# Patient Record
Sex: Female | Born: 1965 | Race: White | Hispanic: Yes | Marital: Married | Smoking: Never smoker
Health system: Southern US, Community
[De-identification: ages and names within clinical notes are randomized; demographics above are authoritative.]

---

## 2018-12-05 ENCOUNTER — Emergency Department (HOSPITAL_BASED_OUTPATIENT_CLINIC_OR_DEPARTMENT_OTHER): Payer: Self-pay

## 2018-12-05 ENCOUNTER — Encounter (HOSPITAL_BASED_OUTPATIENT_CLINIC_OR_DEPARTMENT_OTHER): Payer: Self-pay | Admitting: Emergency Medicine

## 2018-12-05 ENCOUNTER — Emergency Department (HOSPITAL_BASED_OUTPATIENT_CLINIC_OR_DEPARTMENT_OTHER)
Admission: EM | Admit: 2018-12-05 | Discharge: 2018-12-05 | Disposition: A | Discharge status: Home / Self Care | Payer: Self-pay | Attending: Emergency Medicine | Admitting: Emergency Medicine

## 2018-12-05 ENCOUNTER — Other Ambulatory Visit: Payer: Self-pay

## 2018-12-05 DIAGNOSIS — R0602 Shortness of breath: Secondary | ICD-10-CM | POA: Insufficient documentation

## 2018-12-05 DIAGNOSIS — R42 Dizziness and giddiness: Secondary | ICD-10-CM | POA: Insufficient documentation

## 2018-12-05 LAB — CBC WITH DIFFERENTIAL/PLATELET
Abs Immature Granulocytes: 0.02 10*3/uL (ref 0.00–0.07)
Basophils Absolute: 0.1 10*3/uL (ref 0.0–0.1)
Basophils Relative: 1 %
Eosinophils Absolute: 0.1 10*3/uL (ref 0.0–0.5)
Eosinophils Relative: 1 %
HCT: 38.4 % (ref 36.0–46.0)
HEMOGLOBIN: 12.2 g/dL (ref 12.0–15.0)
Immature Granulocytes: 0 %
Lymphocytes Relative: 44 %
Lymphs Abs: 2.8 10*3/uL (ref 0.7–4.0)
MCH: 26.5 pg (ref 26.0–34.0)
MCHC: 31.8 g/dL (ref 30.0–36.0)
MCV: 83.5 fL (ref 80.0–100.0)
MONO ABS: 0.5 10*3/uL (ref 0.1–1.0)
Monocytes Relative: 7 %
Neutro Abs: 2.9 10*3/uL (ref 1.7–7.7)
Neutrophils Relative %: 47 %
Platelets: 344 10*3/uL (ref 150–400)
RBC: 4.6 MIL/uL (ref 3.87–5.11)
RDW: 14.7 % (ref 11.5–15.5)
WBC: 6.4 10*3/uL (ref 4.0–10.5)
nRBC: 0 % (ref 0.0–0.2)

## 2018-12-05 LAB — URINALYSIS, MICROSCOPIC (REFLEX)

## 2018-12-05 LAB — URINALYSIS, ROUTINE W REFLEX MICROSCOPIC
Bilirubin Urine: NEGATIVE
Glucose, UA: NEGATIVE mg/dL
Ketones, ur: NEGATIVE mg/dL
Nitrite: NEGATIVE
Protein, ur: NEGATIVE mg/dL
pH: 6 (ref 5.0–8.0)

## 2018-12-05 LAB — BASIC METABOLIC PANEL
Anion gap: 8 (ref 5–15)
BUN: 10 mg/dL (ref 6–20)
CO2: 23 mmol/L (ref 22–32)
Calcium: 9.1 mg/dL (ref 8.9–10.3)
Chloride: 105 mmol/L (ref 98–111)
Creatinine, Ser: 0.65 mg/dL (ref 0.44–1.00)
GFR calc Af Amer: >60 mL/min (ref >60)
GFR calc non Af Amer: >60 mL/min (ref >60)
Glucose, Bld: 91 mg/dL (ref 70–99)
Potassium: 3.3 mmol/L — ABNORMAL LOW (ref 3.5–5.1)
Sodium: 136 mmol/L (ref 135–145)

## 2018-12-05 LAB — TROPONIN I: Troponin I: <0.03 ng/mL (ref <0.03)

## 2018-12-05 MED ORDER — METOCLOPRAMIDE HCL 5 MG/ML IJ SOLN
10.0000 mg | Freq: Once | INTRAMUSCULAR | Status: AC
  Administered 2018-12-05: 10 mg via INTRAVENOUS
  Filled 2018-12-05: qty 2

## 2018-12-05 MED ORDER — DIPHENHYDRAMINE HCL 50 MG/ML IJ SOLN
12.5000 mg | Freq: Once | INTRAMUSCULAR | Status: AC
  Administered 2018-12-05: 12.5 mg via INTRAVENOUS
  Filled 2018-12-05: qty 1

## 2018-12-05 MED ORDER — POTASSIUM CHLORIDE CRYS ER 20 MEQ PO TBCR
40.0000 meq | EXTENDED_RELEASE_TABLET | Freq: Once | ORAL | Status: AC
  Administered 2018-12-05: 40 meq via ORAL
  Filled 2018-12-05: qty 2

## 2018-12-05 MED ORDER — SODIUM CHLORIDE 0.9 % IV BOLUS
500.0000 mL | Freq: Once | INTRAVENOUS | Status: AC
  Administered 2018-12-05: 500 mL via INTRAVENOUS

## 2018-12-05 MED ORDER — ACETAMINOPHEN 500 MG PO TABS
1000.0000 mg | ORAL_TABLET | Freq: Once | ORAL | Status: AC
  Administered 2018-12-05: 1000 mg via ORAL
  Filled 2018-12-05: qty 2

## 2018-12-05 NOTE — ED Notes (Signed)
Patient transported to CT 

## 2018-12-05 NOTE — ED Triage Notes (Signed)
Dizziness and headache x 3 days.

## 2018-12-05 NOTE — ED Notes (Signed)
ED Provider at bedside. 

## 2018-12-05 NOTE — ED Notes (Signed)
Pt lying flat 

## 2018-12-05 NOTE — ED Triage Notes (Signed)
While asking the rest of triage questions the patient states that she has an episode of dizziness about 1 week ago and it last a little while and then she did not have any symptoms  - the patient also denies a Headache at this time

## 2018-12-05 NOTE — ED Notes (Signed)
AMBULATING: Pt able to ambulate unassisted, after approx 48ft pt began to sway and state she was dizzy. Able to walk remainder of distance holding onto staff member x1 MIN assist.   SpO2 98-100% HR 60-65bpm

## 2018-12-05 NOTE — ED Notes (Signed)
Will ambulate after fluids are finished. RN aware.

## 2018-12-05 NOTE — ED Notes (Signed)
Patient transported to X-ray 

## 2018-12-05 NOTE — ED Provider Notes (Signed)
[facility] EMERGENCY DEPARTMENT Provider Note   CSN: [id number] Arrival date & time: 12/05/18  1748    History   Chief Complaint Chief Complaint  Patient presents with  . Dizziness   Pt declined translator. She speaks some english and her husband is at bedside  HPI Sydney Hernandez is a 53 y.o. female.     HPI   Pt is a 53 y/o female who presents to the ED today for evaluation of multiple complaints.  Patient complains that she has had intermittent dizziness for the last several years.  She has had recurrence of her symptoms over the last 2 weeks and for the last 3 days she has been dizzy mainly with movement.  States that she feels off balance.  When she has these symptoms, she normally drinks valerian tea and rests and her symptoms resolved.  She states this is consistent with her chronic symptoms over the last 2 years however she became concerned because she was been feeling fatigued lately and has a sour taste in her mouth.  She also complains of some mild nausea and a headache.  She states that she has had an intermittent headache that is "mild "and "not bad ".  States that the pain occurs in different areas of her head and it feels like a "pressure "and "puncture ".  States the episodes last for about 15 minutes.  Denies any vision changes, numbness or weakness.  She states she feels a little bit short of breath today starting when she woke up this morning.  She denies any chest pain or pain with inspiration.  She denies a cough.  She has had no recent surgeries, hospital admissions, extended periods of travel.  She does not use estrogen.  She is not had a history of cancer or VTE.  No lower extremity swelling or calf pain.  She states she has been evaluated for these symptoms in the past in [place], [place] and was told that she had depression.  History reviewed. No pertinent past medical history.  There are no active problems to display for this patient.   History  reviewed. No pertinent surgical history.   OB History   No obstetric history on file.      Home Medications    Prior to Admission medications   Not on File    Family History History reviewed. No pertinent family history.  Social History Social History   Tobacco Use  . Smoking status: Never Smoker  . Smokeless tobacco: Never Used  Substance Use Topics  . Alcohol use: Never    Frequency: Never  . Drug use: Never     Allergies   Patient has no known allergies.   Review of Systems Review of Systems  Constitutional: Negative for fever.  HENT: Negative for ear pain and sore throat.   Eyes: Negative for visual disturbance.  Respiratory: Positive for shortness of breath. Negative for cough.   Cardiovascular: Negative for chest pain.  Gastrointestinal: Positive for nausea. Negative for abdominal pain, constipation, diarrhea and vomiting.  Genitourinary: Negative for dysuria.  Musculoskeletal: Negative for back pain.  Skin: Negative for color change and rash.  Neurological: Positive for dizziness and headaches. Negative for weakness, light-headedness and numbness.  All other systems reviewed and are negative.   Physical Exam Updated Vital Signs BP 118/79 (BP Location: Right Arm)   Pulse 60   Temp 97.8 F (36.6 C)   Resp 16   Ht 5\' 6"  (1.676 m)   Wt  59 kg   SpO2 100%   BMI 20.98 kg/m   Physical Exam Vitals signs and nursing note reviewed.  Constitutional:      General: She is not in acute distress.    Appearance: She is well-developed.  HENT:     Head: Normocephalic and atraumatic.     Right Ear: Tympanic membrane normal.     Ears:     Comments: Scarring to left TM.    Mouth/Throat:     Mouth: Mucous membranes are moist.     Pharynx: No oropharyngeal exudate or posterior oropharyngeal erythema.  Eyes:     Conjunctiva/sclera: Conjunctivae normal.  Neck:     Musculoskeletal: Neck supple. No neck rigidity.  Cardiovascular:     Rate and Rhythm: Normal  rate and regular rhythm.     Heart sounds: Normal heart sounds. No murmur.  Pulmonary:     Effort: Pulmonary effort is normal. No respiratory distress.     Breath sounds: Normal breath sounds. No stridor. No wheezing or rhonchi.  Abdominal:     General: Bowel sounds are normal.     Palpations: Abdomen is soft.     Tenderness: There is no abdominal tenderness. There is no guarding or rebound.  Skin:    General: Skin is warm and dry.  Neurological:     Mental Status: She is alert.     Comments: Mental Status:  Alert, thought content appropriate, able to give a coherent history. Speech fluent without evidence of aphasia. Able to follow 2 step commands without difficulty.  Cranial Nerves:  II:  pupils equal, round, reactive to light III,IV, VI: ptosis not present, extra-ocular motions intact bilaterally  V,VII: smile symmetric, facial light touch sensation equal VIII: hearing grossly normal to voice  X: uvula elevates symmetrically  XI: bilateral shoulder shrug symmetric and strong XII: midline tongue extension without fassiculations Motor:  Normal tone. 5/5 strength of BUE and BLE major muscle groups including strong and equal grip strength and dorsiflexion/plantar flexion Sensory: light touch normal in all extremities. Cerebellar: normal finger-to-nose with bilateral upper extremities Gait: normal gait and balance.  Negative romberg      ED Treatments / Results  Labs (all labs ordered are listed, but only abnormal results are displayed) Labs Reviewed  BASIC METABOLIC PANEL - Abnormal; Notable for the following components:      Result Value   Potassium 3.3 (*)    All other components within normal limits  URINALYSIS, ROUTINE W REFLEX MICROSCOPIC - Abnormal; Notable for the following components:   Color, Urine STRAW (*)    Specific Gravity, Urine <1.005 (*)    Hgb urine dipstick TRACE (*)    Leukocytes,Ua TRACE (*)    All other components within normal limits  URINALYSIS,  MICROSCOPIC (REFLEX) - Abnormal; Notable for the following components:   Bacteria, UA RARE (*)    All other components within normal limits  URINE CULTURE  CBC WITH DIFFERENTIAL/PLATELET  TROPONIN I  I-STAT TROPONIN, ED    EKG EKG Interpretation  Date/Time:  Saturday December 05 2018 18:01:47 EST Ventricular Rate:  76 PR Interval:    QRS Duration: 93 QT Interval:  384 QTC Calculation: 432 R Axis:   92 Text Interpretation:  Sinus rhythm Borderline right axis deviation No old tracing to compare Confirmed by Linwood Dibbles [phone]) on 12/05/2018 6:04:29 PM   Radiology Dg Chest 2 View  Result Date: 12/05/2018 CLINICAL DATA:  Acute chest pain and shortness of breath. EXAM: CHEST - 2 VIEW COMPARISON:  None. FINDINGS: The cardiomediastinal silhouette is unremarkable. There is no evidence of focal airspace disease, pulmonary edema, suspicious pulmonary nodule/mass, pleural effusion, or pneumothorax. No acute bony abnormalities are identified. IMPRESSION: No active cardiopulmonary disease. Electronically Signed   By: Harmon Pier M.D.   On: 12/05/2018 19:45   Ct Head Wo Contrast  Result Date: 12/05/2018 CLINICAL DATA:  Dizziness. EXAM: CT HEAD WITHOUT CONTRAST TECHNIQUE: Contiguous axial images were obtained from the base of the skull through the vertex without intravenous contrast. COMPARISON:  None. FINDINGS: Brain: No evidence of acute infarction, hemorrhage, hydrocephalus, extra-axial collection or mass lesion/mass effect. Vascular: No hyperdense vessel or unexpected calcification. Skull: Normal. Negative for fracture or focal lesion. Sinuses/Orbits: No acute finding. Other: None. IMPRESSION: Negative exam. Electronically Signed   By: Norva Pavlov M.D.   On: 12/05/2018 21:41    Procedures Procedures (including critical care time)  Medications Ordered in ED Medications  sodium chloride 0.9 % bolus 500 mL (0 mLs Intravenous Stopped 12/05/18 2011)  acetaminophen (TYLENOL) tablet 1,000 mg  (1,000 mg Oral Given 12/05/18 1929)  metoCLOPramide (REGLAN) injection 10 mg (10 mg Intravenous Given 12/05/18 1929)  diphenhydrAMINE (BENADRYL) injection 12.5 mg (12.5 mg Intravenous Given 12/05/18 1929)  potassium chloride SA (K-DUR,KLOR-CON) CR tablet 40 mEq (40 mEq Oral Given 12/05/18 2011)     Initial Impression / Assessment and Plan / ED Course  I have reviewed the triage vital signs and the nursing notes.  Pertinent labs & imaging results that were available during my care of the patient were reviewed by me and considered in my medical decision making (see chart for details).       Final Clinical Impressions(s) / ED Diagnoses   Final diagnoses:  Vertigo   Pt with a h/o chronic intermittent dizziness presents with dizziness that has been intermittent for the last two weeks. Also with headache and multiple other concerns.   She is afebrile. Her initial BP is elevated however it improved throughout her stay and even before intervention with pain medications. The remainder of her vitals are normal.  She is nontoxic appearing and is in no acute distress. Her neurologic exam is nonfocal and is very reassuring. She has a negative romberg and has no difficulty with ambulation.   Orthostatics negative Amb with pulse ox and she maintain oxygen saturation at 98 200% on room air.  CBC with no leukocytosis or anemia. CMP with mild hypokalemia supplemented in the ED.  Normal liver and kidney function. Trop negative.  CXR is within normal limits. EKG with NSR. Borderline right axis deviation, No old tracing to compare.   CT of the head was negative for acute abnormality.  She was initially ambulated about 20 minutes after she was given her medications and reportedly felt dizzy.  On reassessment medications patient states that her symptoms have completely resolved.  She no longer has a headache and no longer feels dizzy.  I ambulated her a second time up and down the hallway and she  reported no recurrence of dizziness.  She also did not feel any shortness of breath.  States she feels completely back to baseline.  This point I have very low suspicion for CVA or other central cause of her symptoms, especially given the chronicity of patient's symptoms over a period of several years.  I do feel that she would likely benefit from referral to neurology and therefore referral was given.  Advised her to make appoint for follow-up and to return the ER for new or  worsening symptoms.  She voiced understanding the plan and reasons to return.  All questions answered.  Patient stable for discharge.  ED Discharge Orders         Ordered    Ambulatory referral to Neurology    Comments:  An appointment is requested in approximately: 1-2 weeks   12/05/18 2226           Rayne DuCouture, Maureen Duesing S, PA-C 12/05/18 2228    Linwood DibblesKnapp, Jon, MD 12/05/18 2239

## 2018-12-05 NOTE — Discharge Instructions (Addendum)
You were given a referral to neurology. The office will contact you to make an appointment for follow up.   Please return to the emergency department for any new or worsening symptoms.   ------------------------------------------------------------------------------------------   Te derivaron a neurologa. La oficina se pondr en contacto con usted para hacer una cita para el seguimiento.   Por favor, regrese al departamento de emergencias para cualquier sntoma nuevo o que empeore.

## 2018-12-07 LAB — URINE CULTURE: Culture: <10000 — AB

## 2018-12-08 ENCOUNTER — Emergency Department (HOSPITAL_BASED_OUTPATIENT_CLINIC_OR_DEPARTMENT_OTHER)
Admission: EM | Admit: 2018-12-08 | Discharge: 2018-12-08 | Disposition: A | Discharge status: Home / Self Care | Payer: Self-pay | Attending: Emergency Medicine | Admitting: Emergency Medicine

## 2018-12-08 ENCOUNTER — Encounter (HOSPITAL_BASED_OUTPATIENT_CLINIC_OR_DEPARTMENT_OTHER): Payer: Self-pay | Admitting: Emergency Medicine

## 2018-12-08 ENCOUNTER — Other Ambulatory Visit: Payer: Self-pay

## 2018-12-08 DIAGNOSIS — R42 Dizziness and giddiness: Secondary | ICD-10-CM | POA: Insufficient documentation

## 2018-12-08 LAB — BASIC METABOLIC PANEL
Anion gap: 6 (ref 5–15)
BUN: 10 mg/dL (ref 6–20)
CALCIUM: 9 mg/dL (ref 8.9–10.3)
CO2: 25 mmol/L (ref 22–32)
Chloride: 105 mmol/L (ref 98–111)
Creatinine, Ser: 0.59 mg/dL (ref 0.44–1.00)
GFR calc Af Amer: >60 mL/min (ref >60)
Glucose, Bld: 102 mg/dL — ABNORMAL HIGH (ref 70–99)
Potassium: 3.5 mmol/L (ref 3.5–5.1)
SODIUM: 136 mmol/L (ref 135–145)

## 2018-12-08 MED ORDER — KETOROLAC TROMETHAMINE 30 MG/ML IJ SOLN
30.0000 mg | Freq: Once | INTRAMUSCULAR | Status: AC
  Administered 2018-12-08: 30 mg via INTRAVENOUS
  Filled 2018-12-08: qty 1

## 2018-12-08 MED ORDER — MECLIZINE HCL 25 MG PO TABS: 25.0000 mg | ORAL_TABLET | Freq: Three times a day (TID) | ORAL | 0 refills | Status: AC | PRN

## 2018-12-08 MED ORDER — METOCLOPRAMIDE HCL 5 MG/ML IJ SOLN
10.0000 mg | Freq: Once | INTRAMUSCULAR | Status: AC
  Administered 2018-12-08: 10 mg via INTRAVENOUS
  Filled 2018-12-08: qty 2

## 2018-12-08 MED ORDER — SODIUM CHLORIDE 0.9 % IV BOLUS
1000.0000 mL | Freq: Once | INTRAVENOUS | Status: AC
  Administered 2018-12-08: 1000 mL via INTRAVENOUS

## 2018-12-08 MED ORDER — DIPHENHYDRAMINE HCL 50 MG/ML IJ SOLN
25.0000 mg | Freq: Once | INTRAMUSCULAR | Status: AC
  Administered 2018-12-08: 25 mg via INTRAVENOUS
  Filled 2018-12-08: qty 1

## 2018-12-08 NOTE — ED Provider Notes (Signed)
[facility] EMERGENCY DEPARTMENT Provider Note   CSN: [id number] Arrival date & time: 12/08/18  1540    History   Chief Complaint Chief Complaint  Patient presents with  . Dizziness    HPI Sydney Hernandez is a 53 y.o. female.     Patient is a 53 year old female with history of chronic, episodic dizziness.  She presents today with complaints of headache and dizziness, along with multiple other issues.  She states that she has a pain to the back of her head along with a dizzy sensation that she describes as a "spinning, as if I am drunk".  This is worse when she stands and moves and turns her head.  She also complains of a sour taste in her mouth, cold feet, and having difficulty sleeping.  She was here 3 days ago with similar complaints.  Extensive work-up was performed including CT scan of the head, laboratory studies.  She was given IV fluids and medications and was feeling better.  She was then discharged with follow-up with neurology and her PCP.  Symptoms have since returned.  The history is provided by the patient.  Dizziness  Quality:  Room spinning and vertigo Severity:  Moderate Onset quality:  Gradual Duration:  5 days Timing:  Constant Progression:  Unchanged Chronicity:  New Relieved by:  Nothing Worsened by:  Standing up and turning head Ineffective treatments:  None tried   History reviewed. No pertinent past medical history.  There are no active problems to display for this patient.   History reviewed. No pertinent surgical history.   OB History   No obstetric history on file.      Home Medications    Prior to Admission medications   Not on File    Family History No family history on file.  Social History Social History   Tobacco Use  . Smoking status: Never Smoker  . Smokeless tobacco: Never Used  Substance Use Topics  . Alcohol use: Never    Frequency: Never  . Drug use: Never     Allergies   Patient has no known  allergies.   Review of Systems Review of Systems  Neurological: Positive for dizziness.  All other systems reviewed and are negative.    Physical Exam Updated Vital Signs BP 131/85 (BP Location: Left Arm)   Pulse 81   Temp 98.6 F (37 C) (Oral)   Resp 16   Ht 5\' 6"  (1.676 m)   Wt 58 kg   SpO2 98%   BMI 20.64 kg/m   Physical Exam Vitals signs and nursing note reviewed.  Constitutional:      General: She is not in acute distress.    Appearance: Normal appearance. She is well-developed. She is not ill-appearing, toxic-appearing or diaphoretic.  HENT:     Head: Normocephalic and atraumatic.     Right Ear: Tympanic membrane normal.     Left Ear: Tympanic membrane normal.     Mouth/Throat:     Mouth: Mucous membranes are moist.  Eyes:     Extraocular Movements: Extraocular movements intact.     Pupils: Pupils are equal, round, and reactive to light.  Neck:     Musculoskeletal: Normal range of motion and neck supple.  Cardiovascular:     Rate and Rhythm: Normal rate and regular rhythm.     Heart sounds: No murmur. No friction rub. No gallop.   Pulmonary:     Effort: Pulmonary effort is normal. No respiratory distress.  Breath sounds: Normal breath sounds. No wheezing.  Abdominal:     General: Bowel sounds are normal. There is no distension.     Palpations: Abdomen is soft.     Tenderness: There is no abdominal tenderness.  Musculoskeletal: Normal range of motion.     Right lower leg: No edema.     Left lower leg: No edema.  Skin:    General: Skin is warm and dry.  Neurological:     General: No focal deficit present.     Mental Status: She is alert and oriented to person, place, and time.     Cranial Nerves: No cranial nerve deficit.     Motor: No weakness.     Coordination: Coordination normal.      ED Treatments / Results  Labs (all labs ordered are listed, but only abnormal results are displayed) Labs Reviewed  BASIC METABOLIC PANEL     EKG None  Radiology No results found.  Procedures Procedures (including critical care time)  Medications Ordered in ED Medications  sodium chloride 0.9 % bolus 1,000 mL (has no administration in time range)  metoCLOPramide (REGLAN) injection 10 mg (has no administration in time range)  diphenhydrAMINE (BENADRYL) injection 25 mg (has no administration in time range)  ketorolac (TORADOL) 30 MG/ML injection 30 mg (has no administration in time range)     Initial Impression / Assessment and Plan / ED Course  I have reviewed the triage vital signs and the nursing notes.  Pertinent labs & imaging results that were available during my care of the patient were reviewed by me and considered in my medical decision making (see chart for details).  Patient given medications here in the ER and is feeling better.  She continues to complain about cold hands and feet.  She has brisk capillary refill and ulnar and radial pulses are bounding as are DP and PT pulses to both feet.  I am also uncertain as to what is causing the patient to have a sour taste in her mouth, but nothing today appears emergent.  Patient needs to follow-up with her primary care doctor.  I will prescribe her meclizine which she can take for her dizziness.  Final Clinical Impressions(s) / ED Diagnoses   Final diagnoses:  None    ED Discharge Orders    None       Geoffery Lyons, MD 12/08/18 [phone]

## 2018-12-08 NOTE — ED Notes (Signed)
ED Provider at bedside. 

## 2018-12-08 NOTE — Discharge Instructions (Signed)
Begin taking meclizine as prescribed as needed for dizziness.  Follow-up with your primary doctor in the next week if your symptoms or not improving.

## 2018-12-08 NOTE — ED Triage Notes (Signed)
Pt reports ongoing dizziness and headache. Was seen here 2/29 for same. Her PCP cannot see her until later this month.

## 2018-12-15 ENCOUNTER — Ambulatory Visit: Payer: Self-pay | Admitting: Neurology

## 2020-02-11 IMAGING — CT CT HEAD W/O CM
3 of 4 series · 15 of 47 positions shown, 18 images · non-contrast
Comparison: None.

CLINICAL DATA: Dizziness.

EXAM:
CT HEAD WITHOUT CONTRAST
TECHNIQUE: Contiguous axial images were obtained from the base of the skull
through the vertex without intravenous contrast.

[Series 2: head wo · axial · 0.39mm/px · z∈[+774,+900]mm · 9 of 31 slices shown, 12 images]
[im 3/31  brain]
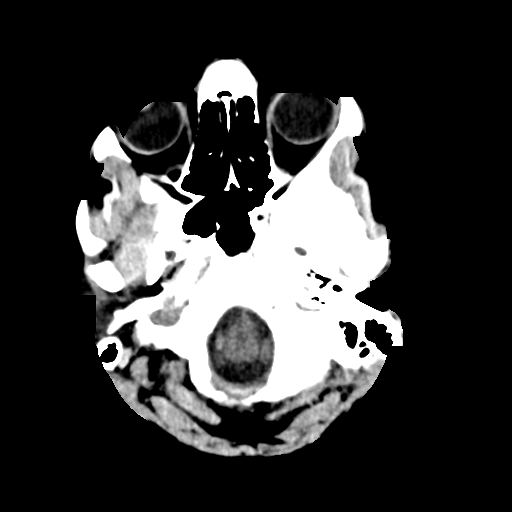
[im 3/31  bone]
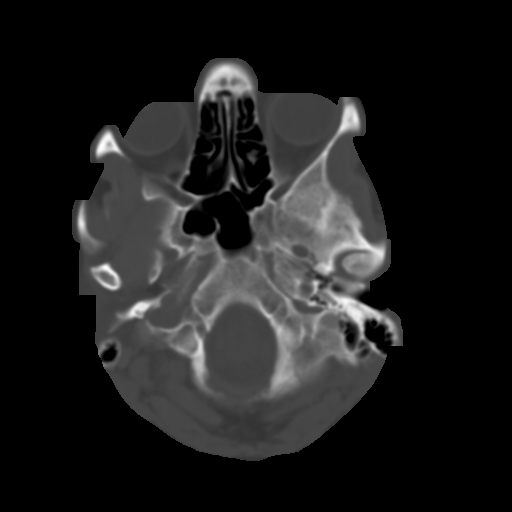
[im 6/31  brain]
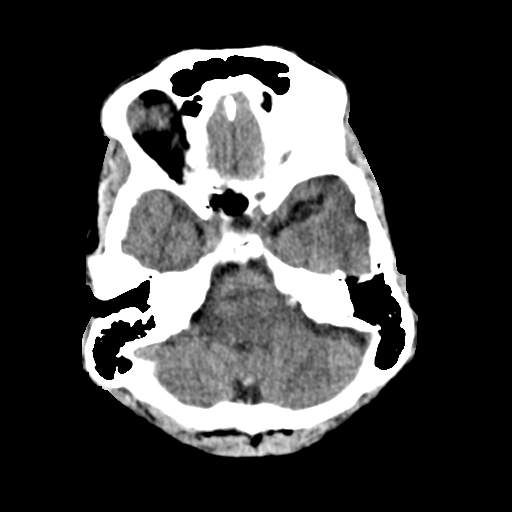
[im 9/31  brain]
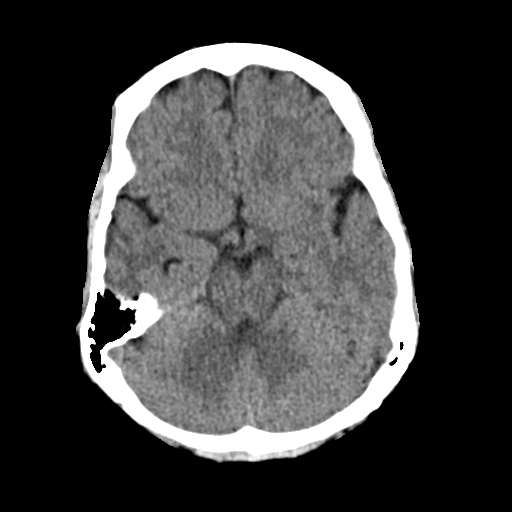
[im 13/31  brain]
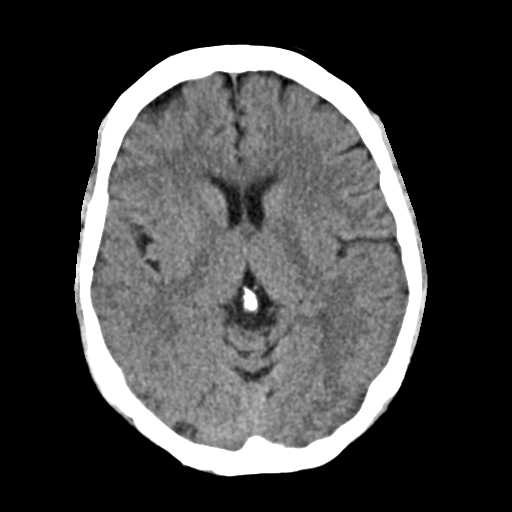
[im 15/31  brain]
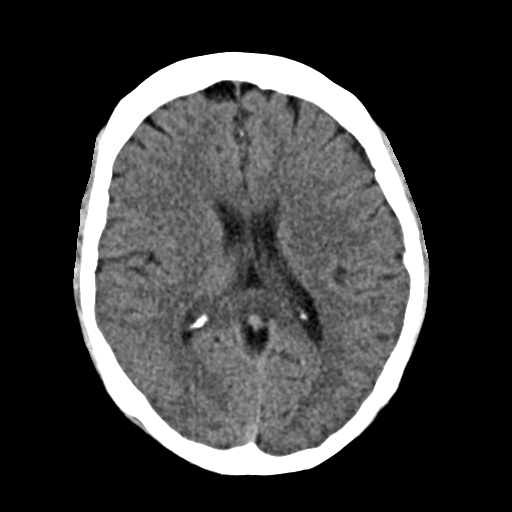
[im 15/31  bone]
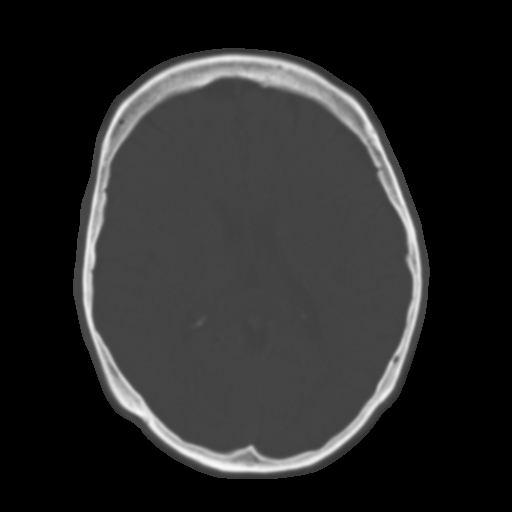
[im 18/31  brain]
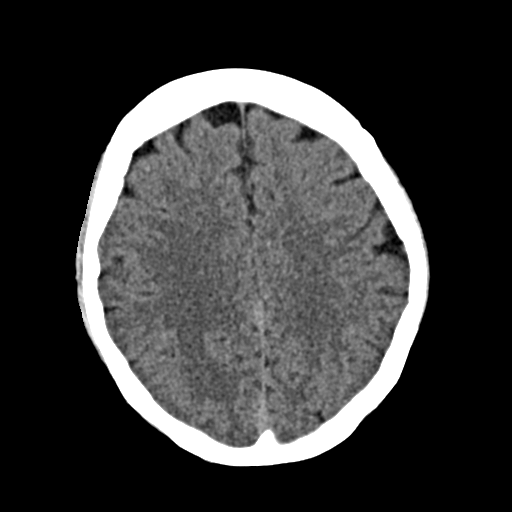
[im 22/31  brain]
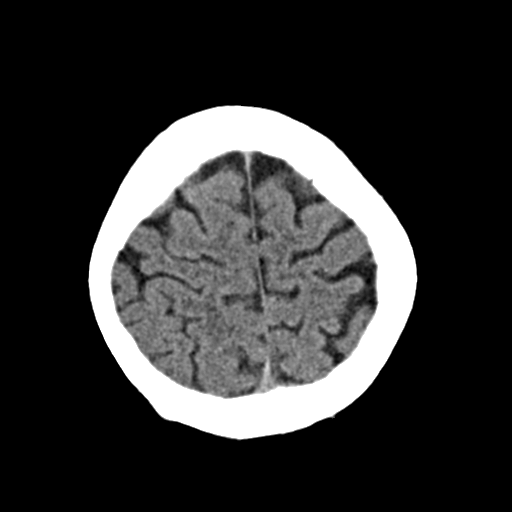
[im 25/31  brain]
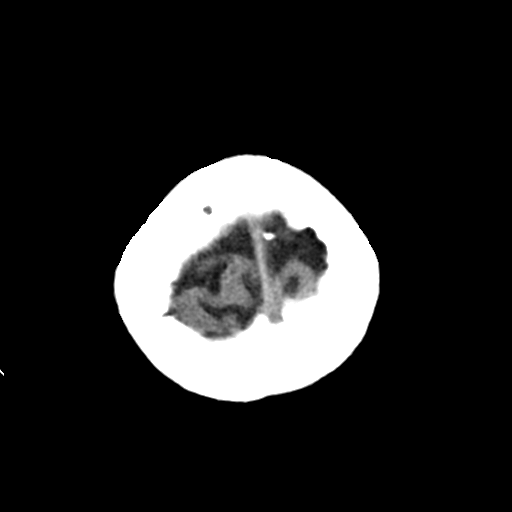
[im 28/31  brain]
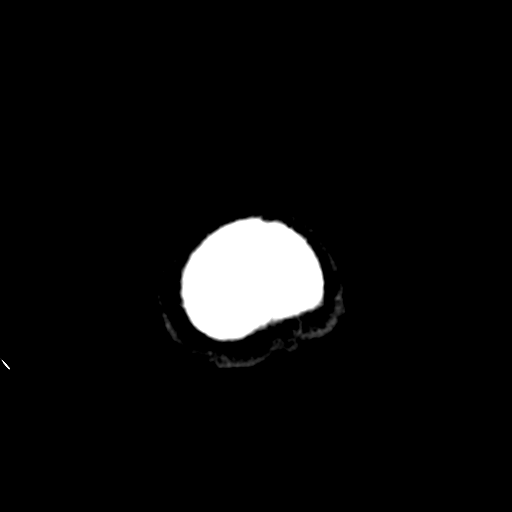
[im 28/31  bone]
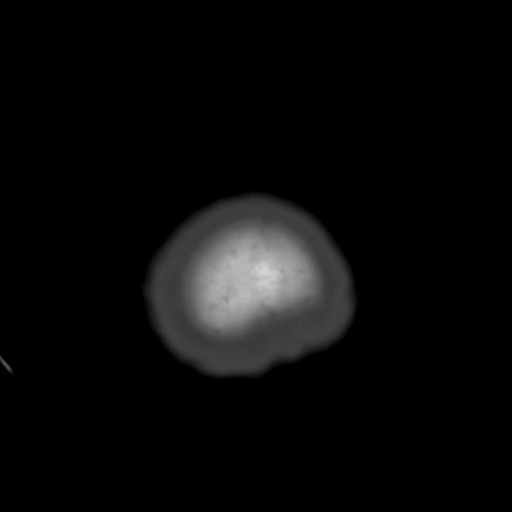

[Series 4: coronal soft · coronal · 0.31mm/px · 3 of 67 slices shown]
[im 23/67  brain]
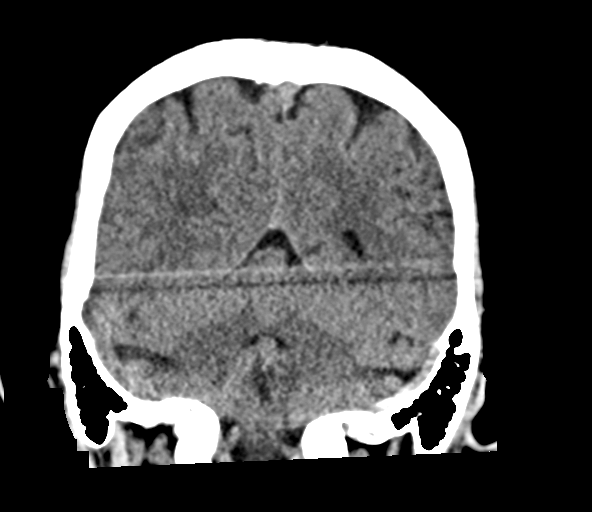
[im 30/67  brain]
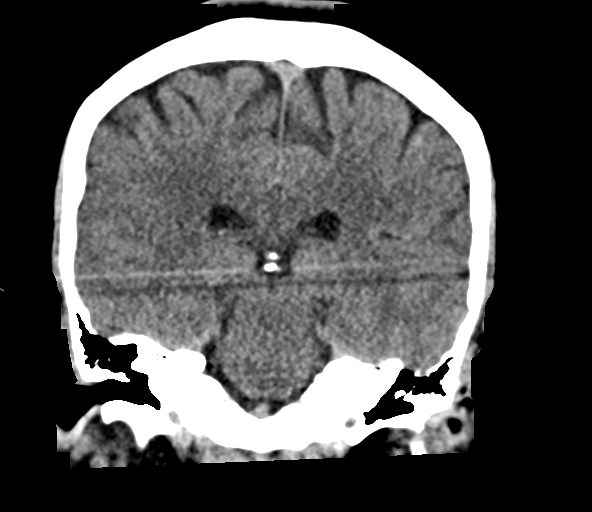
[im 37/67  brain]
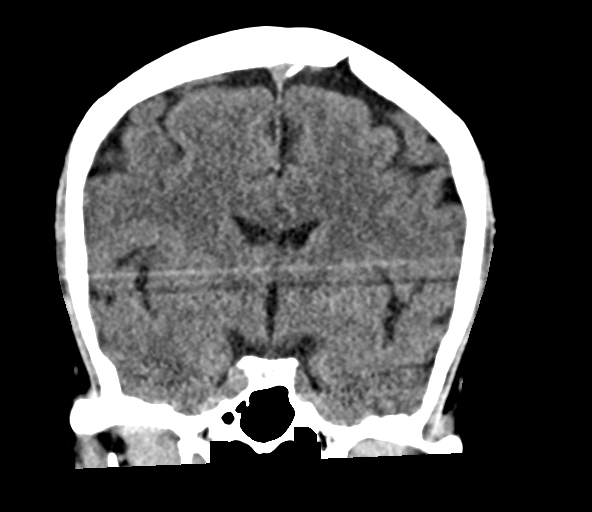

[Series 5: sag soft · sagittal · 0.29mm/px · 3 of 65 slices shown]
[im 22/65  brain]
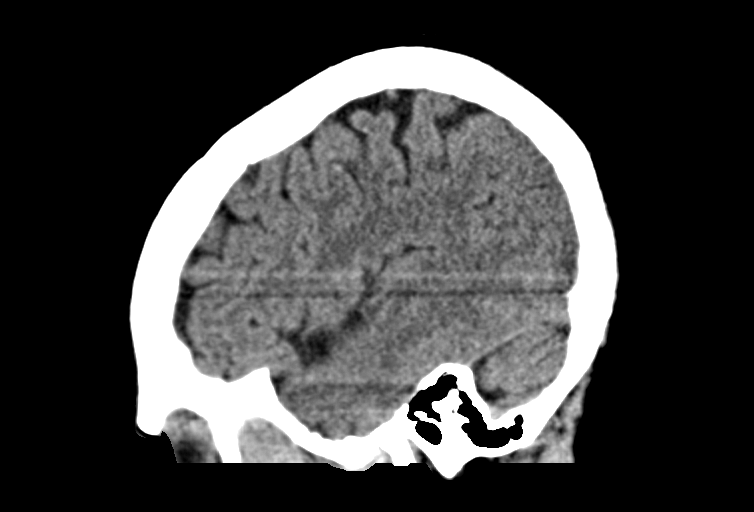
[im 33/65  brain]
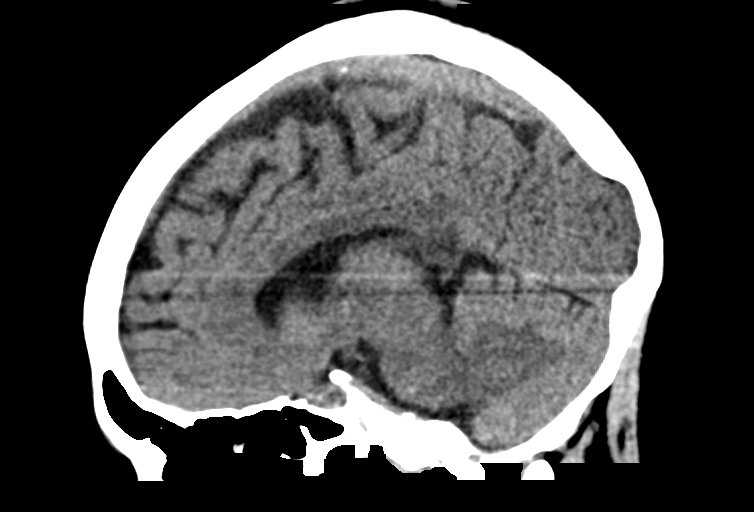
[im 43/65  brain]
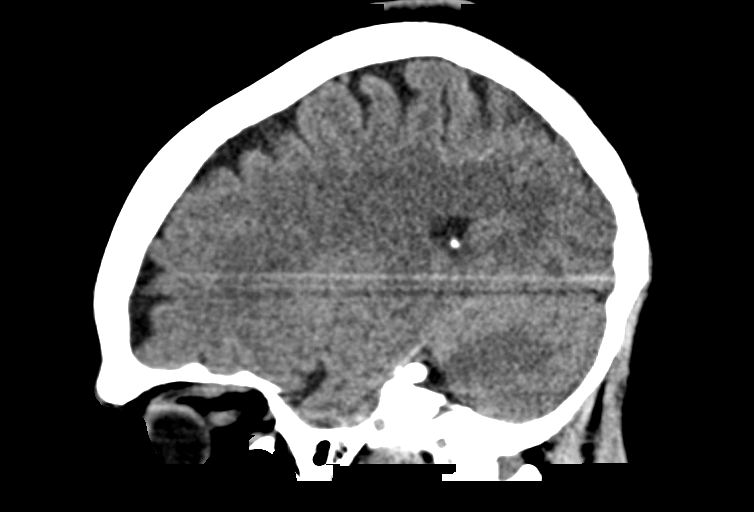

[15 of 47 positions shown; findings below may reference images not displayed]

FINDINGS: Brain: No evidence of acute infarction, hemorrhage, hydrocephalus,
extra-axial collection or mass lesion/mass effect.

Vascular: No hyperdense vessel or unexpected calcification.

Skull: Normal. Negative for fracture or focal lesion.

Sinuses/Orbits: No acute finding.

Other: None.
IMPRESSION: Negative exam.
